# Patient Record
Sex: Female | Born: 1935 | Race: White | Hispanic: No | Smoking: Never smoker
Health system: Southern US, Community
[De-identification: ages and names within clinical notes are randomized; demographics above are authoritative.]

## PROBLEM LIST (undated history)

## (undated) DIAGNOSIS — C801 Malignant (primary) neoplasm, unspecified: Secondary | ICD-10-CM

## (undated) DIAGNOSIS — K219 Gastro-esophageal reflux disease without esophagitis: Secondary | ICD-10-CM

## (undated) DIAGNOSIS — J45909 Unspecified asthma, uncomplicated: Secondary | ICD-10-CM

## (undated) DIAGNOSIS — D759 Disease of blood and blood-forming organs, unspecified: Secondary | ICD-10-CM

## (undated) HISTORY — PX: CHOLECYSTECTOMY: SHX55

## (undated) HISTORY — DX: Gastro-esophageal reflux disease without esophagitis: K21.9

## (undated) HISTORY — PX: COLONOSCOPY: SHX174

## (undated) HISTORY — PX: UPPER GASTROINTESTINAL ENDOSCOPY: SHX188

## (undated) HISTORY — PX: ROTATOR CUFF REPAIR: SHX139

## (undated) HISTORY — PX: ABDOMINAL HYSTERECTOMY: SHX81

## (undated) HISTORY — DX: Unspecified asthma, uncomplicated: J45.909

---

## 1999-08-04 ENCOUNTER — Inpatient Hospital Stay (HOSPITAL_COMMUNITY): Admission: AD | Admit: 1999-08-04 | Discharge: 1999-08-05 | Payer: Self-pay | Admitting: Cardiology

## 1999-08-05 ENCOUNTER — Encounter: Payer: Self-pay | Admitting: Cardiology

## 2007-08-06 ENCOUNTER — Ambulatory Visit: Payer: Self-pay | Admitting: Cardiology

## 2007-08-12 ENCOUNTER — Ambulatory Visit: Payer: Self-pay | Admitting: Cardiology

## 2007-09-06 ENCOUNTER — Ambulatory Visit: Payer: Self-pay | Admitting: Cardiology

## 2010-05-17 NOTE — Assessment & Plan Note (Signed)
[facility]                          [facility]   NAME:Alicia Campbell, Alicia Campbell                        MRN:          [id number]  DATE:08/06/2007                            DOB:          Apr 20, 1935    Len Childs is referred for cardiac evaluation.  She is very pleasant  lady.  She is 75 years of age.  There is no prior documented coronary  disease.  She had had some shortness of breath and had actually been  evaluated in 2001.  CT scan had showed no pulmonary embolus at that time  and a nuclear scan revealed no significant abnormalities.  She is  active.  She walks regularly and can walk 2 miles a day on level ground  without any significant problems.  However, when she walks up a hill in  her backyard she gets shortness of breath and fatigue in her legs.  This  happens on a regular basis.  She does not have chest pain.  She has had  no syncope or presyncope.  She goes about full activities.  There is no  history of significant diabetes or hypertension.  She does have a family  history of coronary disease.   PAST MEDICAL HISTORY:   ALLERGIES:  All of the CILLINs.   MEDICATIONS:  Vitamins and iron.   OTHER MEDICAL PROBLEMS:  See the list below.   SOCIAL HISTORY:  The patient lives here in town and she does not smoke.   FAMILY HISTORY:  There is a family history of heart disease.  Her mother  died at a young age of heart disease.   REVIEW OF SYSTEMS:  Other than the HPI, her review of systems today is  negative.   PHYSICAL EXAMINATION:  VITAL SIGNS:  Blood pressure is 142/87.  Pulse is  80.  Weight is 155 pounds.  GENERAL:  The patient is oriented to person, time and place.  Affect is  normal.  HEENT:  No xanthelasma.  She has normal extraocular motion.  NECK:  There are no carotid bruits.  There is no jugular venous  distention.  LUNGS:  Clear.  Respiratory effort is not labored.  CARDIAC:  An S1 with an S2.  There are no clicks or  significant murmurs.  ABDOMEN:  Soft.  EXTREMITIES:  She has no significant peripheral edema.   The patient had an echocardiogram done on July 22, 2007, and read by Dr.  Shelva Majestic.  She has normal left ventricular function and size.  She has  mild mitral annular calcification.  There were no significant wall  motion abnormalities.  EKG reveals no significant abnormalities.   PROBLEMS:  1. History of a hysterectomy and gallbladder surgery in the past.  2. History of sinus surgery  3. Question of some asthma historically.  She rarely uses an inhaler.  4. History of shortness of breath and leg fatigue when walking up a      hill.  5. Normal left ventricular function.   At this point, it is not clear if the patient's symptoms are related to  ischemia or  not.  We will proceed with a nuclear scan with her walking  on the treadmill.  She will also have a leg Doppler.  I will then see  her back for followup.  It is possible that some of her symptoms are in  fact the form of exertional asthma.  We will see her back for followup  after the testing is done.     Luis Abed, MD, [facility]  Electronically Signed    JDK/MedQ  DD: 08/06/2007  DT: 08/07/2007  Job #: [id number]   cc:   Doreen Beam, MD

## 2010-05-17 NOTE — Assessment & Plan Note (Signed)
[facility]                          [facility]   NAME:CROSSHermelinda, Campbell                        MRN:          [id number]  DATE:09/06/2007                            DOB:          Mar 20, 1935    Alicia Campbell is here for followup.  I saw her on August 06, 2007.  We  decided that we should check ABIs and they were completely normal.  Also, she had a nuclear scan revealing no ischemia.  She is actually  feeling well.  She does have some shortness of breath and this may be  related to her asthma.  I do not think that it is cardiac in origin at  this time.  I had a nice discussion with the patient.  We reviewed all  of this.  Further workup was not necessary.   PAST MEDICAL HISTORY:   ALLERGIES:  All of the CILLINs.   MEDICATIONS:  Vitamins, lisinopril and omeprazole.   OTHER MEDICAL PROBLEMS:  See the list on the note of August 06, 2007.   REVIEW OF SYSTEMS:  Her review of systems today is negative.  She feels  well.   PHYSICAL EXAMINATION:  VITAL SIGNS:  Blood pressure is 125/77.  LUNGS:  Clear.  Respiratory effort is not labored.  CARDIAC:  Reveals an S1 with an S2.  There are no clicks or significant  murmurs.   Problems are listed on the note of August 06, 2007.  She is stable.  No  further cardiac workup is needed.  I will be happy to see her over time  as needed.     Luis Abed, MD, [facility]  Electronically Signed    JDK/MedQ  DD: 09/06/2007  DT: 09/07/2007  Job #: [id number]   cc:   Doreen Beam, MD

## 2010-05-20 NOTE — Discharge Summary (Signed)
[place]  Patient:    Alicia Campbell, Alicia Campbell                        MRN: [id number] Adm. Date:  [id number] Disc. Date: [id number] Attending:  Mirian Mo Dictator:   Joellyn Rued, P.A.C. CC:         Dr. Derald Macleod, [place]   Discharge Summary  DATE OF BIRTH: 1935/10/23  HISTORY OF PRESENT ILLNESS: Alicia Campbell is a 74 year old white female, who presented with chest heaviness and shortness of breath, and slight presyncope for the past six months.  She had noted gradual increase in dyspnea on exertion with walking up stairs and when lifting groceries.  She had also noted intermittent left-sided chest discomfort, a vague aching sensation which lasted minutes and spontaneously resolved with rest.  This was often brought on by exertion, but would occasionally happen at rest, approximately two times per week, and occasionally she would have shortness of breath with these episodes.  She denied nausea or vomiting or diaphoresis.  The morning of presentation the patient left to walk her usual 1.5 miles, and after about three-quarters of a mile she suddenly became short of breath and felt like she was going to pass out.  She also complained of chest heaviness, and felt as if she were going to die.  She walked home slowly and the heaviness gradually resolved within five minutes.  She was sent to the emergency room by her primary care physician because of her risk factors.  Her risk factors include age, and early family history.  Otherwise, her history is essentially unremarkable.  LABORATORY DATA: EKG showed sinus bradycardia with low voltage, delayed R wave progression.  Chest x-ray did not show any abnormality.  CT did not show any evidence of pulmonary embolus.  Hemoglobin 11.9, hematocrit 34.9; normal indices; platelets 132,000; WBC 7.9. PT was 13.9, PTT 42.  D-dimer was 0.47.  Sodium 140, potassium 3.6, glucose 169, BUN 12, creatinine 0.8.  CKs  and troponins were negative for myocardial infarction.  HOSPITAL COURSE: Overnight she did not have any further chest discomfort.  She ruled out for myocardial infarction.  A stress Cardiolite study was performed, and she ambulated for a total of three minutes and 51 minutes, achieving a heart rate of 141, which was greater than 85% expected maximal heart rate. She did have chest discomfort and shortness of breath, which promptly resolved.  She did have slight ST segment depression in leads V4 and V6; however, these also resolved.  Her echocardiogram showed normal LV function, mild mitral valve prolapse.  Stress imaging showed an EF of 75%, no signs of ischemia.  DISCHARGE DIAGNOSIS: Noncardiac chest discomfort, with negative stress Cardiolite study and echocardiogram. DISCHARGE MEDICATIONS:  1. Allegra.  2. Nasonex.  DISCHARGE DIET: Maintain low-fat/low-salt/low-cholesterol diet.  FOLLOW-UP: Arrange a follow-up appointment with Dr. Nobie Putnam in [place], [place]. DD:  09/02/99 TD:  09/02/99 Job: [id number]

## 2010-08-15 ENCOUNTER — Other Ambulatory Visit (INDEPENDENT_AMBULATORY_CARE_PROVIDER_SITE_OTHER): Payer: Self-pay | Admitting: Internal Medicine

## 2011-02-01 ENCOUNTER — Other Ambulatory Visit (HOSPITAL_COMMUNITY): Payer: Self-pay | Admitting: Rheumatology

## 2011-02-01 DIAGNOSIS — I739 Peripheral vascular disease, unspecified: Secondary | ICD-10-CM

## 2011-02-07 ENCOUNTER — Ambulatory Visit (HOSPITAL_COMMUNITY)
Admission: RE | Admit: 2011-02-07 | Discharge: 2011-02-07 | Disposition: A | Discharge status: Home / Self Care | Payer: Medicare Other | Source: Ambulatory Visit | Attending: Rheumatology | Admitting: Rheumatology

## 2011-02-07 DIAGNOSIS — M79609 Pain in unspecified limb: Secondary | ICD-10-CM | POA: Insufficient documentation

## 2011-02-07 DIAGNOSIS — I739 Peripheral vascular disease, unspecified: Secondary | ICD-10-CM

## 2011-02-07 DIAGNOSIS — M545 Low back pain, unspecified: Secondary | ICD-10-CM | POA: Insufficient documentation

## 2011-02-07 DIAGNOSIS — M5126 Other intervertebral disc displacement, lumbar region: Secondary | ICD-10-CM | POA: Insufficient documentation

## 2011-02-08 ENCOUNTER — Other Ambulatory Visit (HOSPITAL_COMMUNITY): Payer: Self-pay | Admitting: Rheumatology

## 2011-02-08 DIAGNOSIS — M6281 Muscle weakness (generalized): Secondary | ICD-10-CM

## 2011-02-08 DIAGNOSIS — M545 Low back pain: Secondary | ICD-10-CM

## 2011-02-08 DIAGNOSIS — R238 Other skin changes: Secondary | ICD-10-CM

## 2011-02-08 DIAGNOSIS — R700 Elevated erythrocyte sedimentation rate: Secondary | ICD-10-CM

## 2011-02-14 ENCOUNTER — Ambulatory Visit (HOSPITAL_COMMUNITY): Admission: RE | Admit: 2011-02-14 | Payer: Medicare Other | Source: Ambulatory Visit

## 2011-02-21 ENCOUNTER — Ambulatory Visit (HOSPITAL_COMMUNITY)
Admission: RE | Admit: 2011-02-21 | Discharge: 2011-02-21 | Disposition: A | Discharge status: Home / Self Care | Payer: Medicare Other | Source: Ambulatory Visit | Attending: Rheumatology | Admitting: Rheumatology

## 2011-02-21 DIAGNOSIS — R161 Splenomegaly, not elsewhere classified: Secondary | ICD-10-CM | POA: Insufficient documentation

## 2011-02-21 DIAGNOSIS — R700 Elevated erythrocyte sedimentation rate: Secondary | ICD-10-CM | POA: Insufficient documentation

## 2011-02-21 DIAGNOSIS — R238 Other skin changes: Secondary | ICD-10-CM

## 2011-02-21 DIAGNOSIS — M6281 Muscle weakness (generalized): Secondary | ICD-10-CM | POA: Insufficient documentation

## 2011-02-21 DIAGNOSIS — M545 Low back pain, unspecified: Secondary | ICD-10-CM | POA: Insufficient documentation

## 2011-02-21 MED ORDER — IOHEXOL 300 MG/ML  SOLN
100.0000 mL | Freq: Once | INTRAMUSCULAR | Status: AC | PRN
  Administered 2011-02-21: 100 mL via INTRAVENOUS

## 2011-02-23 LAB — POCT I-STAT, CHEM 8
Creatinine, Ser: 0.8 mg/dL (ref 0.50–1.10)
HCT: 40 % (ref 36.0–46.0)
Hemoglobin: 13.6 g/dL (ref 12.0–15.0)
Potassium: 4.4 mEq/L (ref 3.5–5.1)
Sodium: 138 mEq/L (ref 135–145)
TCO2: 25 mmol/L (ref 0–100)

## 2011-08-25 ENCOUNTER — Other Ambulatory Visit (INDEPENDENT_AMBULATORY_CARE_PROVIDER_SITE_OTHER): Payer: Self-pay | Admitting: Internal Medicine

## 2011-08-28 NOTE — Telephone Encounter (Signed)
I have requested records from [facility]

## 2011-08-31 ENCOUNTER — Ambulatory Visit (INDEPENDENT_AMBULATORY_CARE_PROVIDER_SITE_OTHER): Payer: Medicare Other | Admitting: Internal Medicine

## 2011-08-31 ENCOUNTER — Encounter (INDEPENDENT_AMBULATORY_CARE_PROVIDER_SITE_OTHER): Payer: Self-pay | Admitting: Internal Medicine

## 2011-08-31 VITALS — BP 140/64 | HR 80 | Temp 98.1°F | Ht 62.5 in | Wt 159.3 lb

## 2011-08-31 DIAGNOSIS — K219 Gastro-esophageal reflux disease without esophagitis: Secondary | ICD-10-CM

## 2011-08-31 DIAGNOSIS — J45909 Unspecified asthma, uncomplicated: Secondary | ICD-10-CM | POA: Insufficient documentation

## 2011-08-31 MED ORDER — SUCRALFATE 1 GM/10ML PO SUSP: 1.0000 g | Freq: Four times a day (QID) | ORAL | Status: DC

## 2011-08-31 NOTE — Addendum Note (Signed)
Addended by: Len Blalock on: 08/31/2011 09:54 AM   Modules accepted: Level of Service

## 2011-08-31 NOTE — Progress Notes (Addendum)
Subjective:     Patient ID: Alicia Campbell, female   DOB: 1935-09-01, 76 y.o.   MRN: [id number]  HPI  Alicia Campbell presents today with c/o of esophageal burning all down to her stomach. She has to cough and clear her throat all the time. She feels like their is congestion in her throat. Symptoms x 2 weels She was seen Dr Debria Garret at [facility] and given samples of Dexilant and Rx for Protonix.  She says foods feel like they are coming back up in her esohagus after she eats a couple of hour later. No dysphagia.  She has a choking feeling after she eats. Symptoms have lasted 3-5 days. Appetite is good. No weight loss.  No abdominal pain. She does have esohpageal burning.  No recent antibiotics.   Review of Systems see hpi Current Outpatient Prescriptions  Medication Sig Dispense Refill  . dexlansoprazole (DEXILANT) 60 MG capsule Take 60 mg by mouth daily.      Marland Kitchen omeprazole (PRILOSEC) 20 MG capsule TAKE ONE CAPSULE BY MOUTH TWICE DAILY  60 capsule  3  . pantoprazole (PROTONIX) 40 MG tablet Take 40 mg by mouth daily.       Past Medical History  Diagnosis Date  . Asthma    Past Surgical History  Procedure Date  . Cholecystectomy   . Rotator cuff repair     left  . Abdominal hysterectomy     Ovaries intact   History   Social History  . Marital Status: Widowed    Spouse Name: N/A    Number of Children: N/A  . Years of Education: N/A   Occupational History  . Not on file.   Social History Main Topics  . Smoking status: Never Smoker   . Smokeless tobacco: Not on file  . Alcohol Use: No  . Drug Use: No  . Sexually Active: Not on file   Other Topics Concern  . Not on file   Social History Narrative  . No narrative on file   Family Status  Relation Status Death Age  . Mother Deceased     CAD  . Father Deceased     CAD, DM2   Allergies  Allergen Reactions  . Penicillins     Hives        Objective:   Physical Exam  Filed Vitals:   08/31/11 0926    Height: 5' 2.5" (1.588 m)  Weight: 159 lb 4.8 oz (72.258 kg)   Alert and oriented. Skin warm and dry. Oral mucosa is moist.   . Sclera anicteric, conjunctivae is pink. Thyroid not enlarged. No cervical lymphadenopathy. Lungs clear. Heart regular rate and rhythm.  Abdomen is soft. Bowel sounds are positive. No hepatomegaly. No abdominal masses felt. No tenderness.  No edema to lower extremities.       Assessment:   Possible PUD, esophagitis. Dexilant is helping.    Plan:    Carafate 1gm QId, Continue the Dexilant.  When finished, start the Protonix. OV in one month.  H pylori.  If not better will consider EGD with Dr. Karilyn Cota.

## 2011-08-31 NOTE — Patient Instructions (Addendum)
H.pylori. Continue the Dexilant. When finished Dexilant start the Protonix. OV in 1 month with me.  If not better EGD.

## 2011-09-01 LAB — H. PYLORI ANTIBODY, IGG: H Pylori IgG: 0.9 {ISR}

## 2011-10-25 ENCOUNTER — Encounter (INDEPENDENT_AMBULATORY_CARE_PROVIDER_SITE_OTHER): Payer: Self-pay | Admitting: Internal Medicine

## 2011-10-25 ENCOUNTER — Ambulatory Visit (INDEPENDENT_AMBULATORY_CARE_PROVIDER_SITE_OTHER): Payer: Medicare Other | Admitting: Internal Medicine

## 2011-10-25 VITALS — BP 146/64 | HR 76 | Temp 98.0°F | Ht 62.5 in | Wt 160.6 lb

## 2011-10-25 DIAGNOSIS — K219 Gastro-esophageal reflux disease without esophagitis: Secondary | ICD-10-CM

## 2011-10-25 MED ORDER — OMEPRAZOLE 20 MG PO CPDR: 20.0000 mg | DELAYED_RELEASE_CAPSULE | Freq: Every day | ORAL | Status: DC

## 2011-10-25 MED ORDER — OMEPRAZOLE 20 MG PO CPDR: 20.0000 mg | DELAYED_RELEASE_CAPSULE | Freq: Two times a day (BID) | ORAL | Status: DC

## 2011-10-25 NOTE — Progress Notes (Signed)
Subjective:     Patient ID: Alicia Campbell, female   DOB: 1935-12-02, 76 y.o.   MRN: [id number]  HPI Here today for f/u. She was seen last month for burning in her esophagus. She had a cough. She had symptoms for 2 weeks. She saw Dr. Debria Garret at [facility] and given sampes of Dexilant and an Rx for Protonix. I gave her an Rx for Carafate liquid which helped. She tells me she feels good. Presently taking Protonix. She is symptom free. She cut Protonix to 20 mg daily. No acid reflux. Appetite is good. No weight loss. 100% better.   Review of Systems see hpi Current Outpatient Prescriptions  Medication Sig Dispense Refill  . dexlansoprazole (DEXILANT) 60 MG capsule Take 60 mg by mouth daily.      Marland Kitchen omeprazole (PRILOSEC) 20 MG capsule TAKE ONE CAPSULE BY MOUTH TWICE DAILY  60 capsule  3  . pantoprazole (PROTONIX) 40 MG tablet Take 40 mg by mouth daily.      . sucralfate (CARAFATE) 1 GM/10ML suspension Take 10 mLs (1 g total) by mouth 4 (four) times daily.  420 mL  1   Past Medical History  Diagnosis Date  . Asthma   . GERD (gastroesophageal reflux disease)    Past Surgical History  Procedure Date  . Cholecystectomy   . Rotator cuff repair     left  . Abdominal hysterectomy     Ovaries intact   History   Social History  . Marital Status: Widowed    Spouse Name: N/A    Number of Children: N/A  . Years of Education: N/A   Occupational History  . Not on file.   Social History Main Topics  . Smoking status: Never Smoker   . Smokeless tobacco: Not on file  . Alcohol Use: No  . Drug Use: No  . Sexually Active: Not on file   Other Topics Concern  . Not on file   Social History Narrative  . No narrative on file   Family Status  Relation Status Death Age  . Mother Deceased     CAD  . Father Deceased     CAD, DM2   Allergies  Allergen Reactions  . Penicillins     Hives        Objective:   Physical Exam  Filed Vitals:   10/25/11 0944  BP: 146/64    Pulse: 76  Temp: 98 F (36.7 C)  Height: 5' 2.5" (1.588 m)  Weight: 160 lb 9.6 oz (72.848 kg)  Alert and oriented. Skin warm and dry. Oral mucosa is moist.   . Sclera anicteric, conjunctivae is pink. Thyroid not enlarged. No cervical lymphadenopathy. Lungs clear. Heart regular rate and rhythm.  Abdomen is soft. Bowel sounds are positive. No hepatomegaly. No abdominal masses felt. No tenderness.  No edema to lower extremities.        Assessment:    GERD. She feels 100% better at this time. Symptoms are controlled.     Plan:     OV prn. Will e prescribe an RX for Omeprazole which she is requesting.  Finish the Protonix before picking up the Omeprazole.  May return on a prn basis. Any problems call our office.

## 2011-10-25 NOTE — Patient Instructions (Addendum)
Rx for Omeprazole to [facility]. OV prn

## 2011-11-07 ENCOUNTER — Encounter (INDEPENDENT_AMBULATORY_CARE_PROVIDER_SITE_OTHER): Payer: Self-pay

## 2012-01-01 ENCOUNTER — Encounter: Payer: Medicare Other | Admitting: Internal Medicine

## 2012-01-01 DIAGNOSIS — D696 Thrombocytopenia, unspecified: Secondary | ICD-10-CM

## 2012-01-22 ENCOUNTER — Encounter (INDEPENDENT_AMBULATORY_CARE_PROVIDER_SITE_OTHER): Payer: Medicare Other | Admitting: Internal Medicine

## 2012-01-22 DIAGNOSIS — D696 Thrombocytopenia, unspecified: Secondary | ICD-10-CM

## 2012-01-22 DIAGNOSIS — I10 Essential (primary) hypertension: Secondary | ICD-10-CM

## 2012-01-29 ENCOUNTER — Other Ambulatory Visit: Payer: Self-pay

## 2012-02-02 ENCOUNTER — Other Ambulatory Visit (INDEPENDENT_AMBULATORY_CARE_PROVIDER_SITE_OTHER): Payer: Self-pay | Admitting: Internal Medicine

## 2012-11-23 ENCOUNTER — Other Ambulatory Visit (INDEPENDENT_AMBULATORY_CARE_PROVIDER_SITE_OTHER): Payer: Self-pay | Admitting: Internal Medicine

## 2012-11-27 NOTE — Telephone Encounter (Signed)
Apt has been scheduled for 04/08/13 with Dr. Rehman.  

## 2013-02-20 ENCOUNTER — Other Ambulatory Visit (INDEPENDENT_AMBULATORY_CARE_PROVIDER_SITE_OTHER): Payer: Self-pay | Admitting: Internal Medicine

## 2013-02-20 DIAGNOSIS — K219 Gastro-esophageal reflux disease without esophagitis: Secondary | ICD-10-CM

## 2013-03-25 ENCOUNTER — Encounter: Payer: Self-pay | Admitting: *Deleted

## 2013-03-25 ENCOUNTER — Ambulatory Visit (INDEPENDENT_AMBULATORY_CARE_PROVIDER_SITE_OTHER): Payer: 59 | Admitting: Cardiology

## 2013-03-25 VITALS — BP 145/79 | HR 100 | Ht 62.5 in | Wt 147.0 lb

## 2013-03-25 DIAGNOSIS — R079 Chest pain, unspecified: Secondary | ICD-10-CM

## 2013-03-25 DIAGNOSIS — R0600 Dyspnea, unspecified: Secondary | ICD-10-CM

## 2013-03-25 DIAGNOSIS — R0609 Other forms of dyspnea: Secondary | ICD-10-CM

## 2013-03-25 DIAGNOSIS — R0989 Other specified symptoms and signs involving the circulatory and respiratory systems: Secondary | ICD-10-CM

## 2013-03-25 NOTE — Patient Instructions (Signed)
Your physician recommends that you continue on your current medications as directed. Please refer to the Current Medication list given to you today.  We will call you later today or tomorrow if you need further testing or follow up. If you have any question call Clemie at [phone].

## 2013-03-25 NOTE — Progress Notes (Signed)
Clinical Summary Ms. Acklin is a 78 y.o.female seen today as a new patient. She is referred for dyspnea.   1. Dyspnea/Chest pain - started 4 months ago. Occurs with activity, such as walking short distances.  - Can get chest pain at times x 4 months. Sharp pain in midchest to back, mild pain. Occurs only with exertion. She is unsure how long pains lasts for. She is unsure of change in frequency or severity over time. - denies any LE edema, no orthopnea though sleeps on wedge for GERD, no PND - hx of asthma, followed by Dr Luan Pulling. Has been treated over the last 4 months with abx and steroids without improvement in her SOB. She also has anemia with most recent Hgb 8.2, this is followed by her pcp.   CAD risk factors: father MI age 62, mother age 57 ? heart related congenital heart disease, uncle MI in 39s.   Past Medical History  Diagnosis Date  . Asthma   . GERD (gastroesophageal reflux disease)      Allergies  Allergen Reactions  . Penicillins     Hives     Current Outpatient Prescriptions  Medication Sig Dispense Refill  . omeprazole (PRILOSEC) 20 MG capsule TAKE ONE CAPSULE BY MOUTH TWICE DAILY  60 capsule  6  . pantoprazole (PROTONIX) 40 MG tablet Take 40 mg by mouth daily.       No current facility-administered medications for this visit.     Past Surgical History  Procedure Laterality Date  . Cholecystectomy    . Rotator cuff repair      left  . Abdominal hysterectomy      Ovaries intact     Allergies  Allergen Reactions  . Penicillins     Hives      Family History  Problem Relation Age of Onset  . Heart attack Mother 15  . Congenital heart disease Mother   . Diabetes Father      Social History Ms. Pruitt reports that she has never smoked. She has never used smokeless tobacco. Ms. Gumbs reports that she does not drink alcohol.   Review of Systems CONSTITUTIONAL: No weight loss, fever, chills, weakness or fatigue.  HEENT: Eyes: No visual  loss, blurred vision, double vision or yellow sclerae.No hearing loss, sneezing, congestion, runny nose or sore throat.  SKIN: No rash or itching.  CARDIOVASCULAR: per HPI RESPIRATORY: SOB GASTROINTESTINAL: No anorexia, nausea, vomiting or diarrhea. No abdominal pain or blood.  GENITOURINARY: No burning on urination, no polyuria NEUROLOGICAL: No headache, dizziness, syncope, paralysis, ataxia, numbness or tingling in the extremities. No change in bowel or bladder control.  MUSCULOSKELETAL: No muscle, back pain, joint pain or stiffness.  LYMPHATICS: No enlarged nodes. No history of splenectomy.  PSYCHIATRIC: No history of depression or anxiety.  ENDOCRINOLOGIC: No reports of sweating, cold or heat intolerance. No polyuria or polydipsia.  Marland Kitchen   Physical Examination p 100 bp 145/79 Wt 147 lbs BMI 26 Gen: resting comfortably, no acute distress HEENT: no scleral icterus, pupils equal round and reactive, no palptable cervical adenopathy,  CV: RRR, no m/r/g, no JVD, no carotid bruits Resp: Clear to auscultation bilaterally GI: abdomen is soft, non-tender, non-distended, normal bowel sounds, no hepatosplenomegaly MSK: extremities are warm, no edema.  Skin: warm, no rash Neuro:  no focal deficits Psych: appropriate affect   Diagnostic Studies Echo PCP Office 03/2013 My read: LVEF 60-65%, cannot distinguish WMAs due to imaging, grade I diastolic dysfunction, aortic and mitral valve  anular calcification, small pericardial effusino adjacent to RV with no tamponade.      Assessment and Plan  1. Dyspnea/chest pain - symptoms and risk factors concerning for possible cardiac etiology, including obstructive CAD - will obtain stress echo with pulse ox to further evaluate        Arnoldo Lenis, M.D., F.A.C.C.

## 2013-03-26 ENCOUNTER — Encounter: Payer: Self-pay | Admitting: Cardiology

## 2013-03-26 ENCOUNTER — Telehealth: Payer: Self-pay | Admitting: Cardiology

## 2013-03-26 DIAGNOSIS — R079 Chest pain, unspecified: Secondary | ICD-10-CM

## 2013-03-26 DIAGNOSIS — I2510 Atherosclerotic heart disease of native coronary artery without angina pectoris: Secondary | ICD-10-CM

## 2013-03-26 DIAGNOSIS — I25810 Atherosclerosis of coronary artery bypass graft(s) without angina pectoris: Secondary | ICD-10-CM

## 2013-03-26 NOTE — Telephone Encounter (Signed)
Exercise stress echo for 786.51 scheduled for 04-09-13 @ [employer] [facility]

## 2013-03-26 NOTE — Telephone Encounter (Signed)
Message copied by Lewayne Bunting on Wed Mar 26, 2013 12:07 PM ------      Message from: [place] F      Created: Tue Mar 25, 2013 11:41 AM       Please let patient know that I reviewed her echo, overall looks normal. Please order an exercise stress echo for chest pain to be done at [facility], she does not need to hold any of her meds. Please make patient aware we are going to get stress test.            Carlyle Dolly MD ------

## 2013-03-26 NOTE — Telephone Encounter (Signed)
[name] exp 05-10-13

## 2013-03-26 NOTE — Telephone Encounter (Signed)
Called and informed pt of additional test needed. Pt verbalized understanding and pt will come in on Friday 03-28-13 to pick up instruction sheet.

## 2013-04-02 ENCOUNTER — Encounter (HOSPITAL_COMMUNITY): Payer: Self-pay

## 2013-04-02 ENCOUNTER — Ambulatory Visit (HOSPITAL_COMMUNITY)
Admission: RE | Admit: 2013-04-02 | Discharge: 2013-04-02 | Disposition: A | Discharge status: Home / Self Care | Payer: Medicare Other | Source: Ambulatory Visit | Attending: Cardiology | Admitting: Cardiology

## 2013-04-02 DIAGNOSIS — R0989 Other specified symptoms and signs involving the circulatory and respiratory systems: Secondary | ICD-10-CM | POA: Insufficient documentation

## 2013-04-02 DIAGNOSIS — R079 Chest pain, unspecified: Secondary | ICD-10-CM

## 2013-04-02 DIAGNOSIS — I25810 Atherosclerosis of coronary artery bypass graft(s) without angina pectoris: Secondary | ICD-10-CM

## 2013-04-02 DIAGNOSIS — R072 Precordial pain: Secondary | ICD-10-CM

## 2013-04-02 DIAGNOSIS — R0609 Other forms of dyspnea: Secondary | ICD-10-CM | POA: Insufficient documentation

## 2013-04-02 NOTE — Progress Notes (Signed)
Stress Lab Nurses Notes - Alicia Campbell  Alicia Campbell 04/02/2013 Reason for doing test: Chest Pain and Dyspnea Type of test: Stress Echo Nurse performing test: Gerrit Halls, RN Nuclear Medicine Tech: Not Applicable Echo Tech: Jamison Neighbor MD performing test: Branch/K.Purcell Nails NP Family MD: Woody Seller Test explained and consent signed: yes IV started: No IV started Symptoms: fatigue & Dyspnea on exertion Treatment/Intervention: None Reason test stopped: fatigue and SOB After recovery IV was: [facility] Patient to return to [place]. Med at : [facility] Patient discharged: Home Patient's Condition upon discharge was: stable Comments:  Resting O2 sat checked 98% room air. During test peak BP 160/70 ,HR 136 & O2 Sat 96% .  Recovery BP 150/75, HR 109 & O2 sat 98% .  Symptoms resolved in recovery. Geanie Cooley T

## 2013-04-02 NOTE — Progress Notes (Signed)
*  PRELIMINARY RESULTS* Echocardiogram Echocardiogram Stress Test has been performed.  [place], [place] 04/02/2013, 11:55 AM

## 2013-04-03 ENCOUNTER — Telehealth: Payer: Self-pay | Admitting: Cardiology

## 2013-04-03 NOTE — Telephone Encounter (Signed)
Pt informed of results. Offered to schedule 2 month appt today but pt wanted to wait and see primary care doctor before scheduling.

## 2013-04-03 NOTE — Telephone Encounter (Signed)
Message copied by Lewayne Bunting on Thu Apr 03, 2013 10:59 AM ------      Message from: [place] F      Created: Thu Apr 03, 2013  8:58 AM       Please let patient know that her stress test was normal. There does not appear to be a cardiac cause of her symptoms. I'd like to see her back in 2 months. Her pcp may also consider other testing for her.            Carlyle Dolly MD ------

## 2013-04-08 ENCOUNTER — Encounter (HOSPITAL_COMMUNITY): Payer: Self-pay | Admitting: Pharmacy Technician

## 2013-04-08 ENCOUNTER — Encounter (INDEPENDENT_AMBULATORY_CARE_PROVIDER_SITE_OTHER): Payer: Self-pay | Admitting: *Deleted

## 2013-04-08 ENCOUNTER — Other Ambulatory Visit (INDEPENDENT_AMBULATORY_CARE_PROVIDER_SITE_OTHER): Payer: Self-pay | Admitting: *Deleted

## 2013-04-08 ENCOUNTER — Encounter (INDEPENDENT_AMBULATORY_CARE_PROVIDER_SITE_OTHER): Payer: Self-pay | Admitting: Internal Medicine

## 2013-04-08 ENCOUNTER — Ambulatory Visit (INDEPENDENT_AMBULATORY_CARE_PROVIDER_SITE_OTHER): Payer: Medicare Other | Admitting: Internal Medicine

## 2013-04-08 VITALS — BP 130/70 | HR 82 | Temp 98.6°F | Resp 18 | Ht 62.5 in | Wt 144.8 lb

## 2013-04-08 DIAGNOSIS — R161 Splenomegaly, not elsewhere classified: Secondary | ICD-10-CM

## 2013-04-08 DIAGNOSIS — D696 Thrombocytopenia, unspecified: Secondary | ICD-10-CM | POA: Insufficient documentation

## 2013-04-08 DIAGNOSIS — D649 Anemia, unspecified: Secondary | ICD-10-CM

## 2013-04-08 DIAGNOSIS — R634 Abnormal weight loss: Secondary | ICD-10-CM

## 2013-04-08 DIAGNOSIS — R1310 Dysphagia, unspecified: Secondary | ICD-10-CM

## 2013-04-08 DIAGNOSIS — K219 Gastro-esophageal reflux disease without esophagitis: Secondary | ICD-10-CM

## 2013-04-08 DIAGNOSIS — R079 Chest pain, unspecified: Secondary | ICD-10-CM

## 2013-04-08 LAB — PROTIME-INR
INR: 1.22 (ref <1.50)
Prothrombin Time: 15.2 seconds (ref 11.6–15.2)

## 2013-04-08 LAB — CBC
HEMATOCRIT: 25 % — AB (ref 36.0–46.0)
HEMOGLOBIN: 8 g/dL — AB (ref 12.0–15.0)
MCH: 24.2 pg — AB (ref 26.0–34.0)
MCHC: 32 g/dL (ref 30.0–36.0)
MCV: 75.5 fL — ABNORMAL LOW (ref 78.0–100.0)
Platelets: 56 10*3/uL — ABNORMAL LOW (ref 150–400)
RBC: 3.31 MIL/uL — AB (ref 3.87–5.11)
RDW: 23.1 % — ABNORMAL HIGH (ref 11.5–15.5)
WBC: 2.4 10*3/uL — ABNORMAL LOW (ref 4.0–10.5)

## 2013-04-08 MED ORDER — PANTOPRAZOLE SODIUM 40 MG PO TBEC: 40.0000 mg | DELAYED_RELEASE_TABLET | Freq: Two times a day (BID) | ORAL | Status: DC

## 2013-04-08 NOTE — Progress Notes (Signed)
Presenting complaint;  Chest pain, dysphagia shortness of breath and weakness.  History of present illness;  Patient is 78 year old Caucasian female who was last seen in October 2013 for gastroesophageal reflux disease and now presents with multiple complaints. She states she has not been feeling well for the last 4 months. She began to experience exertional dyspnea, chest pain and dry hacking cough. She was evaluated by Dr. Linde Gillis of pulmonology service in [place], [place] and felt to have mild airway disease not contributing to her symptoms. She was subsequently evaluated by Dr. Carlyle Dolly, cardiologist with [facility] and noninvasive cardiac evaluation was negative and now she is here for further evaluation. She states she feels miserable. Her symptoms have progressed. For the last one month she has experienced nausea and dysphagia. She has poor appetite and has lost 15 pounds. She has most difficulty with solids and particularly breads. Chest pain is retrosternal and described as soreness worse after coughing spells. She states it feels different than heartburn. She denies vomiting abdominal pain melena or rectal bleeding. She also denies vaginal bleeding or hematuria. She does not take NSAIDs. She does not drink alcohol or smoke cigarettes. She underwent EGD at [facility] in August 2011 revealing erosive reflux esophagitis and hiatal hernia. She states her last colonoscopy was about 5 years ago and was within normal limits([facility]). Review of the systems is negative for fever chills or night sweats. She feels frustrated because her symptoms are getting worse instead of getting better.   Current Medications: Outpatient Encounter Prescriptions as of 04/08/2013  Medication Sig  . omeprazole (PRILOSEC) 20 MG capsule TAKE ONE CAPSULE BY MOUTH TWICE DAILY  . [DISCONTINUED] albuterol (VENTOLIN HFA) 108 (90 BASE) MCG/ACT inhaler Inhale 2 puffs into the lungs every 6 (six) hours as needed  for wheezing or shortness of breath.  . [DISCONTINUED] budesonide-formoterol (SYMBICORT) 160-4.5 MCG/ACT inhaler Inhale 2 puffs into the lungs 2 (two) times daily.  . [DISCONTINUED] ipratropium-albuterol (DUONEB) 0.5-2.5 (3) MG/3ML SOLN Take 3 mLs by nebulization 3 (three) times daily as needed.   Past medical history; Bronchial asthma diagnosed in 1979 following exposure to chemicals. Gastroesophageal reflux disease. Last EGD in August 2011 revealing erosive reflux esophagitis and hiatal hernia and she has been maintained on PPI. History of anemia and thrombocytopenia. Patient was evaluated by Dr. Audree Camel last year and apparently no cause found. Hysterectomy in early 1970s. Cholecystectomy in 1980s. Repair of her left sided rotator cuff tear in 1990s.  Allergies; Allergies  Allergen Reactions  . Codeine Nausea And Vomiting  . Penicillins     Hives    Objective: Blood pressure 130/70, pulse 82, temperature 98.6 F (37 C), temperature source Oral, resp. rate 18, height 5' 2.5" (1.588 m), weight 144 lb 12.8 oz (65.681 kg). Patient is alert and in no acute distress. She does not have asterixis. Conjunctiva is pale. Sclera is nonicteric Oropharyngeal mucosa is normal. No neck masses or thyromegaly noted. Cardiac exam with regular rhythm normal S1 and S2. No murmur or gallop noted. Lungs are clear to auscultation. Abdomen is full. Bowel sounds are normal. No bruits noted. On palpation abdomen is soft without tenderness or masses. Spleen is easily palpable. Liver edge is indistinct. Rectal examination reveals no stool in the vault but mucus is guaiac negative.  No LE edema or clubbing noted.  Labs/studies Results: Scanned blood work in [employer]. CBC from 03/14/2013.  WBC 3.0, H&H 8.2 and 25.9, MCV 79 and platelet count 43K. Serum sodium 137, potassium  4.1, chloride 98, CO2 22, BUN 15, creatinine 0.68 and calcium 8.9. Bilirubin 0.5, BP 105, AST 35, ALT 24, total protein 7.5 with  albumin of 4.4.  Serum B12 365 CT angioid chest on 10/01/2012 reveals no evidence of pulmonary emboli and revealed calcification to right lobe of thyroid gland.  Assessment:  #1. Dysphagia. Patient has history of GERD. She could have developed an esophageal stricture or she could have motility disorder. This symptom needs to be further evaluated with EGD. #2. Chest pain. Noninvasive cardiac workup negative. Chest pain may well be secondary to esophagitis. Some of her pain appears to be musculoskeletal. #3. Exertional dyspnea and weakness most likely secondary to anemia. #4. Patient has pancytopenia and splenomegaly. No stigmata of liver disease or history of liver disease. Cirrhosis remains in differential diagnosis. Even if she has cirrhosis but would not explain her constitutional symptoms. #5. Weight loss possibly related to anorexia and dysphagia.  Recommendations;  Discontinue omeprazole. Begin pantoprazole 40 mg by mouth twice a day. CBC and INR. Esophagogastroduodenoscopy and possible esophageal dilation unless platelet count is very low. Abdominopelvic CT with contrast. Will request records from Dr. Reynaldo Minium office to determine extent of workup for thrombocytopenia and splenomegaly.

## 2013-04-08 NOTE — Patient Instructions (Signed)
Physician will call with results of blood work and CT when completed. Esophagogastroduodenoscopy to be scheduled

## 2013-04-09 ENCOUNTER — Inpatient Hospital Stay (HOSPITAL_COMMUNITY): Admission: RE | Admit: 2013-04-09 | Payer: 59 | Source: Ambulatory Visit

## 2013-04-11 ENCOUNTER — Encounter (HOSPITAL_COMMUNITY): Payer: Self-pay | Admitting: *Deleted

## 2013-04-11 ENCOUNTER — Encounter (HOSPITAL_COMMUNITY)
Admission: RE | Disposition: A | Discharge status: Home / Self Care | Payer: Self-pay | Source: Ambulatory Visit | Attending: Internal Medicine

## 2013-04-11 ENCOUNTER — Ambulatory Visit (HOSPITAL_COMMUNITY)
Admission: RE | Admit: 2013-04-11 | Discharge: 2013-04-11 | Disposition: A | Discharge status: Home / Self Care | Payer: Medicare Other | Source: Ambulatory Visit | Attending: Internal Medicine | Admitting: Internal Medicine

## 2013-04-11 DIAGNOSIS — R634 Abnormal weight loss: Secondary | ICD-10-CM

## 2013-04-11 DIAGNOSIS — D61818 Other pancytopenia: Secondary | ICD-10-CM | POA: Insufficient documentation

## 2013-04-11 DIAGNOSIS — R1310 Dysphagia, unspecified: Secondary | ICD-10-CM

## 2013-04-11 DIAGNOSIS — Z79899 Other long term (current) drug therapy: Secondary | ICD-10-CM | POA: Insufficient documentation

## 2013-04-11 DIAGNOSIS — R079 Chest pain, unspecified: Secondary | ICD-10-CM

## 2013-04-11 DIAGNOSIS — K219 Gastro-esophageal reflux disease without esophagitis: Secondary | ICD-10-CM

## 2013-04-11 DIAGNOSIS — R161 Splenomegaly, not elsewhere classified: Secondary | ICD-10-CM | POA: Insufficient documentation

## 2013-04-11 DIAGNOSIS — K449 Diaphragmatic hernia without obstruction or gangrene: Secondary | ICD-10-CM | POA: Insufficient documentation

## 2013-04-11 HISTORY — PX: ESOPHAGOGASTRODUODENOSCOPY: SHX5428

## 2013-04-11 HISTORY — PX: BALLOON DILATION: SHX5330

## 2013-04-11 HISTORY — PX: SAVORY DILATION: SHX5439

## 2013-04-11 HISTORY — PX: MALONEY DILATION: SHX5535

## 2013-04-11 SURGERY — EGD (ESOPHAGOGASTRODUODENOSCOPY)
Anesthesia: Moderate Sedation

## 2013-04-11 MED ORDER — MIDAZOLAM HCL 5 MG/5ML IJ SOLN
INTRAMUSCULAR | Status: DC | PRN
  Administered 2013-04-11: 2 mg via INTRAVENOUS
  Administered 2013-04-11: 1 mg via INTRAVENOUS
  Administered 2013-04-11: 2 mg via INTRAVENOUS
  Administered 2013-04-11 (×2): 1 mg via INTRAVENOUS

## 2013-04-11 MED ORDER — MEPERIDINE HCL 50 MG/ML IJ SOLN
INTRAMUSCULAR | Status: DC | PRN
  Administered 2013-04-11: 25 mg via INTRAVENOUS

## 2013-04-11 MED ORDER — BUTAMBEN-TETRACAINE-BENZOCAINE 2-2-14 % EX AERO
INHALATION_SPRAY | CUTANEOUS | Status: DC | PRN
  Administered 2013-04-11: 2 via TOPICAL

## 2013-04-11 MED ORDER — MEPERIDINE HCL 50 MG/ML IJ SOLN
INTRAMUSCULAR | Status: AC
  Filled 2013-04-11: qty 1

## 2013-04-11 MED ORDER — SODIUM CHLORIDE 0.9 % IV SOLN
INTRAVENOUS | Status: DC
  Administered 2013-04-11: 11:00:00 via INTRAVENOUS

## 2013-04-11 MED ORDER — MIDAZOLAM HCL 5 MG/5ML IJ SOLN
INTRAMUSCULAR | Status: AC
  Filled 2013-04-11: qty 10

## 2013-04-11 MED ORDER — STERILE WATER FOR IRRIGATION IR SOLN
Status: DC | PRN
  Administered 2013-04-11: 12:00:00

## 2013-04-11 NOTE — H&P (Addendum)
Alicia Campbell is an 78 y.o. female.   Chief Complaint: Patient is here for EGD and possible ED. HPI: Patient is 78 year old Caucasian female was chronic heard and presents with heartburn regurgitation dysphagia. She also claims of exertional dyspnea progressive weakness. She has history of anemia thrombocytopenia as well as splenomegaly. Following her office visit.  She denies melena or rectal bleeding. Rectal examination in the office revealed guaiac negative stool. CBC from 2 days ago revealed WBC of 2.4 H&H of 8 and 25 an MCV of 75.5 and platelet count 56K. INR was 1.22.   Past Medical History  Diagnosis Date  . Asthma   . GERD (gastroesophageal reflux disease)     Past Surgical History  Procedure Laterality Date  . Cholecystectomy    . Rotator cuff repair      left  . Abdominal hysterectomy      Ovaries intact  . Colonoscopy    . Upper gastrointestinal endoscopy      Family History  Problem Relation Age of Onset  . Heart attack Mother 31  . Congenital heart disease Mother   . Diabetes Father    Social History:  reports that she has never smoked. She has never used smokeless tobacco. She reports that she does not drink alcohol or use illicit drugs.  Allergies:  Allergies  Allergen Reactions  . Codeine Nausea And Vomiting  . Penicillins     Hives    Medications Prior to Admission  Medication Sig Dispense Refill  . pantoprazole (PROTONIX) 40 MG tablet Take 1 tablet (40 mg total) by mouth 2 (two) times daily before a meal.  60 tablet  5    No results found for this or any previous visit (from the past 48 hour(s)). No results found.  ROS  Blood pressure 150/71, pulse 94, temperature 98.1 F (36.7 C), temperature source Oral, resp. rate 18, height 5' 2.5" (1.588 m), weight 144 lb (65.318 kg), SpO2 98.00%. Physical Exam  Constitutional: She appears well-developed and well-nourished.  Eyes:  Conjunctiva is pale; sclerae nonicteric  Neck: No thyromegaly present.   Cardiovascular: Normal rate, regular rhythm and normal heart sounds.   No murmur heard. Respiratory: Effort normal and breath sounds normal.  GI: Soft. Tenderness: mild midepigastric tenderness.  Spleen is palpable.  Musculoskeletal: She exhibits no edema.  Lymphadenopathy:    She has no cervical adenopathy.  Neurological: She is alert.  Skin: Skin is warm and dry.     Assessment/Plan Refractory GERD and dysphagia. Pancytopenia and splenomegaly. CT does not show changes of cirrhosis. EGD and possible ED.  Alicia Campbell 04/11/2013, 12:04 PM

## 2013-04-11 NOTE — Op Note (Addendum)
EGD PROCEDURE REPORT  PATIENT:  Alicia Campbell  MR#:  [id number] Birthdate:  May 18, 1935, 78 y.o., female Endoscopist:  Dr. Rogene Houston, MD Referred By:  Dr. Glenda Chroman, MD Procedure Date: 04/11/2013  Procedure:   EGD with ED.  Indications:  Patient is 78 year old Caucasian female who presents with intermittent solid food dysphagia as well as regurgitation. She was seen in the office earlier in the week and PPI was changed. She also has pancytopenia and splenomegaly.  Platelet count was 56K and INR is normal.            Informed Consent:  The risks, benefits, alternatives & imponderables which include, but are not limited to, bleeding, infection, perforation, drug reaction and potential missed lesion have been reviewed.  The potential for biopsy, lesion removal, esophageal dilation, etc. have also been discussed.  Questions have been answered.  All parties agreeable.  Please see history & physical in medical record for more information.  Medications:  Demerol 25 mg IV Versed 7 mg IV Cetacaine spray topically for oropharyngeal anesthesia  Description of procedure:  The endoscope was introduced through the mouth and advanced to the second portion of the duodenum without difficulty or limitations. The mucosal surfaces were surveyed very carefully during advancement of the scope and upon withdrawal.  Findings:  Esophagus:  Mucosa of the esophagus was normal. The GE junction was unremarkable without ring or stricture formation. GEJ:  37 cm Hiatus:  39 cm Stomach:  Stomach was empty and distended very well with insufflation. Folds in the proximal stomach are normal. Examination mucosa at gastric body, antrum, pyloric channel, angularis, fundus and cardia was normal. Duodenum:  Normal bulbar and post bulbar mucosa.  Therapeutic/Diagnostic Maneuvers Performed:   Esophagus was dilated by passing 54 [place] Maloney dilator to full insertion.As the dilator was withdrawn  endoscope was passed  again and no mucosal disruption noted the esophagus.  Complications:  None  Impression: Small sliding hiatal hernia otherwise normal EGD. Esophagus dilated by passing 54 [place] Maloney dilator but no mucosal disruption noted.  Recommendations:  Patient will continue pantoprazole at 40 mg by mouth twice a day. Proceed with A/P CT as planned. Will arrange office visit with Dr. Audree Camel.  Rogene Houston  04/11/2013  12:36 PM  CC: Dr. Glenda Chroman., MD & Dr. Rayne Du ref. provider found

## 2013-04-11 NOTE — Discharge Instructions (Signed)
Resume usual medications and diet. No driving for 24 hours. OV with Dr. Jacquiline Doe  Esophagogastroduodenoscopy Care After Refer to this sheet in the next few weeks. These instructions provide you with information on caring for yourself after your procedure. Your caregiver may also give you more specific instructions. Your treatment has been planned according to current medical practices, but problems sometimes occur. Call your caregiver if you have any problems or questions after your procedure.  HOME CARE INSTRUCTIONS  Do not eat or drink anything until the numbing medicine (local anesthetic) has worn off and your gag reflex has returned. You will know that the local anesthetic has worn off when you can swallow comfortably.  Do not drive for 12 hours after the procedure or as directed by your caregiver.  Only take medicines as directed by your caregiver. SEEK MEDICAL CARE IF:   You cannot stop coughing.  You are not urinating at all or less than usual. SEEK IMMEDIATE MEDICAL CARE IF:  You have difficulty swallowing.  You cannot eat or drink.  You have worsening throat or chest pain.  You have dizziness, lightheadedness, or you faint.  You have nausea or vomiting.  You have chills.  You have a fever.  You have severe abdominal pain.  You have black, tarry, or bloody stools. Document Released: 12/06/2011 Document Reviewed: 12/06/2011 [facility] Patient Information 2014 [place], [place].

## 2013-04-14 ENCOUNTER — Ambulatory Visit (HOSPITAL_COMMUNITY)
Admission: RE | Admit: 2013-04-14 | Discharge: 2013-04-14 | Disposition: A | Discharge status: Home / Self Care | Payer: Medicare Other | Source: Ambulatory Visit | Attending: Internal Medicine | Admitting: Internal Medicine

## 2013-04-14 DIAGNOSIS — K7689 Other specified diseases of liver: Secondary | ICD-10-CM | POA: Insufficient documentation

## 2013-04-14 DIAGNOSIS — K5710 Diverticulosis of small intestine without perforation or abscess without bleeding: Secondary | ICD-10-CM | POA: Insufficient documentation

## 2013-04-14 DIAGNOSIS — R161 Splenomegaly, not elsewhere classified: Secondary | ICD-10-CM | POA: Insufficient documentation

## 2013-04-14 DIAGNOSIS — R110 Nausea: Secondary | ICD-10-CM | POA: Insufficient documentation

## 2013-04-14 DIAGNOSIS — R634 Abnormal weight loss: Secondary | ICD-10-CM | POA: Insufficient documentation

## 2013-04-14 MED ORDER — IOHEXOL 300 MG/ML  SOLN
100.0000 mL | Freq: Once | INTRAMUSCULAR | Status: AC | PRN
  Administered 2013-04-14: 100 mL via INTRAVENOUS

## 2013-04-15 ENCOUNTER — Encounter (HOSPITAL_COMMUNITY): Payer: Self-pay | Admitting: Internal Medicine

## 2013-04-15 ENCOUNTER — Encounter (INDEPENDENT_AMBULATORY_CARE_PROVIDER_SITE_OTHER): Payer: Self-pay

## 2013-05-06 ENCOUNTER — Ambulatory Visit (INDEPENDENT_AMBULATORY_CARE_PROVIDER_SITE_OTHER): Payer: 59 | Admitting: Internal Medicine

## 2013-10-10 ENCOUNTER — Ambulatory Visit (INDEPENDENT_AMBULATORY_CARE_PROVIDER_SITE_OTHER): Payer: Medicare Other | Admitting: Cardiology

## 2013-10-10 ENCOUNTER — Encounter: Payer: Self-pay | Admitting: Cardiology

## 2013-10-10 VITALS — BP 118/70 | HR 94 | Ht 62.0 in | Wt 148.4 lb

## 2013-10-10 DIAGNOSIS — R0789 Other chest pain: Secondary | ICD-10-CM

## 2013-10-10 NOTE — Patient Instructions (Signed)
There were no changes to your medications. Continue as directed. Your physician wants you to follow up in:  1 year.  You will receive a reminder letter in the mail one-two months in advance.  If you don't receive a letter, please call our office to schedule the follow up appointment. 

## 2013-10-10 NOTE — Progress Notes (Signed)
Clinical Summary Alicia Campbell is a 78 y.o.female seen today for follow up of the following medical problems.   1. Dyspnea/Chest pain  -  Occurs with activity, such as walking short distances.  - Can get chest pain at times x 4 months. Sharp pain in midchest to back, mild pain. Occurs only with exertion. She is unsure how long pains lasts for. She is unsure of change in frequency or severity over time.  - denies any LE edema, no orthopnea though sleeps on wedge for GERD, no PND  - hx of asthma, followed at [facility]. Has been treated over the last 4 months with abx and steroids without improvement in her SOB. She also has anemia with most recent Hgb 8.2, this is followed by her pcp.   - recent CT chest at [facility] 06/2013 for cough, incidental finding of severe CAD of LCX.  - stress echo 04/2013 without ischemia   Past Medical History  Diagnosis Date  . Asthma   . GERD (gastroesophageal reflux disease)      Allergies  Allergen Reactions  . Codeine Nausea And Vomiting  . Penicillins     Hives     Current Outpatient Prescriptions  Medication Sig Dispense Refill  . pantoprazole (PROTONIX) 40 MG tablet Take 1 tablet (40 mg total) by mouth 2 (two) times daily before a meal.  60 tablet  5   No current facility-administered medications for this visit.     Past Surgical History  Procedure Laterality Date  . Cholecystectomy    . Rotator cuff repair      left  . Abdominal hysterectomy      Ovaries intact  . Colonoscopy    . Upper gastrointestinal endoscopy    . Esophagogastroduodenoscopy N/A 04/11/2013    Procedure: ESOPHAGOGASTRODUODENOSCOPY (EGD);  Surgeon: Rogene Houston, MD;  Location: [facility];  Service: Endoscopy;  Laterality: N/A;  830-moved to 1020 Ann to notify pt  . Balloon dilation N/A 04/11/2013    Procedure: BALLOON DILATION;  Surgeon: Rogene Houston, MD;  Location: [facility];  Service: Endoscopy;  Laterality: N/A;  Venia Minks dilation N/A 04/11/2013   Procedure: Venia Minks DILATION;  Surgeon: Rogene Houston, MD;  Location: [facility];  Service: Endoscopy;  Laterality: N/A;  . Savory dilation N/A 04/11/2013    Procedure: SAVORY DILATION;  Surgeon: Rogene Houston, MD;  Location: [facility];  Service: Endoscopy;  Laterality: N/A;     Allergies  Allergen Reactions  . Codeine Nausea And Vomiting  . Penicillins     Hives      Family History  Problem Relation Age of Onset  . Heart attack Mother 57  . Congenital heart disease Mother   . Diabetes Father      Social History Ms. Oscarson reports that she has never smoked. She has never used smokeless tobacco. Ms. Domke reports that she does not drink alcohol.   Review of Systems CONSTITUTIONAL: No weight loss, fever, chills, weakness or fatigue.  HEENT: Eyes: No visual loss, blurred vision, double vision or yellow sclerae.No hearing loss, sneezing, congestion, runny nose or sore throat.  SKIN: No rash or itching.  CARDIOVASCULAR: per HPI RESPIRATORY: No shortness of breath, cough or sputum.  GASTROINTESTINAL: No anorexia, nausea, vomiting or diarrhea. No abdominal pain or blood.  GENITOURINARY: No burning on urination, no polyuria NEUROLOGICAL: No headache, dizziness, syncope, paralysis, ataxia, numbness or tingling in the extremities. No change in bowel or bladder control.  MUSCULOSKELETAL: No muscle, back pain, joint pain or stiffness.  LYMPHATICS: No enlarged nodes. No history of splenectomy.  PSYCHIATRIC: No history of depression or anxiety.  ENDOCRINOLOGIC: No reports of sweating, cold or heat intolerance. No polyuria or polydipsia.  Marland Kitchen   Physical Examination p 94 bp 118/70 Wt 148 lbs BMI 27 Gen: resting comfortably, no acute distress HEENT: no scleral icterus, pupils equal round and reactive, no palptable cervical adenopathy,  CV: RRR, no m/r/g, no JVD, no carotid bruits Resp: Clear to auscultation bilaterally GI: abdomen is soft, non-tender, non-distended, normal  bowel sounds, no hepatosplenomegaly MSK: extremities are warm, no edema.  Skin: warm, no rash Neuro:  no focal deficits Psych: appropriate affect   Diagnostic Studies Echo PCP Office 03/2013  My read: LVEF 60-65%, cannot distinguish WMAs due to imaging, grade I diastolic dysfunction, aortic and mitral valve anular calcification, small pericardial effusino adjacent to RV with no tamponade.   04/2013 Stress echo Study Conclusions  - Stress ECG conclusions: The stress ECG was normal. There were no ischemic changes and no significan arrhythmias. - Staged echo: Normal echo stress Impressions:  - Normal study after maximal exercise. Oxygen saturations remained above 96% throughout study, no evidence of hypoxemia.  Stress results: Maximal heart rate during stress was 136bpm (95% of maximal predicted heart rate). The maximal predicted heart rate was 144bpm.The target heart rate was achieved. The heart rate response to stress was normal. There was a normal resting blood pressure with an appropriate response to stress. The rate-pressure product for the peak heart rate and blood pressure was 16380mm Hg/min. The patient experienced no chest pain during stress.  ------------------------------------------------------------ Stress ECG: The stress ECG was normal. There were no ischemic changes and no significan arrhythmias.  ------------------------------------------------------------ Stress echo results: Left ventricular ejection fraction was normal at rest and with stress. Normal echo stress   07/2013 CT chest Indication: Cough, myelodysplastic syndrome  Comparison Exams: None  Protocol: Contiguous 1.25 mm axial images were obtained from the neck base through the upper abdomen without intravenous contrast in inspiration with bone and soft tissue algorithm reconstruction followed by high resolution expiratory images.  Findings: The central airways are patent. No evidence of  bronchiectasis. There are multiple bilateral 3 to 4 mm pulmonary nodules (series 5, image 134, 143, 162, 81, 97, 114, 120, 131 and 133).  Neck base demonstrates a small low-attenuation lesion within the right thyroid lobe which contains small coarse calcifications. The heart, aorta, and pulmonary arteries are of normal size and configuration. There is severe calcification of the left circumflex coronary artery. Scattered calcifications are seen within the thoracic aorta. There is a loculated pericardial effusion.  No mediastinal, hilar, or axillary lymphadenopathy.  Imaged portions of the upper abdomen demonstrates prior cholecystectomy. The spleen is enlarged measuring up to 17.0 cm. No suspicious lytic or sclerotic osseous lesions.  Impression: 1. No evidence of bronchiectasis. There are multiple 3 to 4 mm pulmonary nodules. Recommend follow-up chest CT in 12 months to assess resolution. 2. Splenomegaly.     Assessment and Plan  1. SOB/Atypical chest pain - patient with noted coronary atherosclerosis on CT chest, stress echo without evidence of ischemia. Appears to be non-obstructive/non-function disease - no further cardiac workup at this time. Contniue risk factor modificaiton, no ASA due to chronic thrombocytopenia and easy bleeding due to her myelodysplastic disorder. She has normal lipids, no DM, no HTN, no tobacco    F/u  1 year  Arnoldo Lenis, M.D.

## 2013-11-04 ENCOUNTER — Other Ambulatory Visit (INDEPENDENT_AMBULATORY_CARE_PROVIDER_SITE_OTHER): Payer: Self-pay | Admitting: Internal Medicine

## 2013-11-10 ENCOUNTER — Other Ambulatory Visit (HOSPITAL_COMMUNITY): Payer: Self-pay | Admitting: Oncology

## 2013-11-10 DIAGNOSIS — D469 Myelodysplastic syndrome, unspecified: Secondary | ICD-10-CM

## 2013-11-10 DIAGNOSIS — D696 Thrombocytopenia, unspecified: Secondary | ICD-10-CM

## 2013-11-11 ENCOUNTER — Other Ambulatory Visit: Payer: Self-pay | Admitting: Radiology

## 2013-11-12 ENCOUNTER — Encounter (HOSPITAL_COMMUNITY): Payer: Self-pay

## 2013-11-12 ENCOUNTER — Ambulatory Visit (HOSPITAL_COMMUNITY)
Admission: RE | Admit: 2013-11-12 | Discharge: 2013-11-12 | Disposition: A | Discharge status: Home / Self Care | Payer: Medicare Other | Source: Ambulatory Visit | Attending: Oncology | Admitting: Oncology

## 2013-11-12 ENCOUNTER — Other Ambulatory Visit (HOSPITAL_COMMUNITY): Payer: Self-pay | Admitting: Oncology

## 2013-11-12 DIAGNOSIS — J45909 Unspecified asthma, uncomplicated: Secondary | ICD-10-CM | POA: Diagnosis not present

## 2013-11-12 DIAGNOSIS — D469 Myelodysplastic syndrome, unspecified: Secondary | ICD-10-CM

## 2013-11-12 DIAGNOSIS — C946 Myelodysplastic disease, not classified: Secondary | ICD-10-CM

## 2013-11-12 DIAGNOSIS — R161 Splenomegaly, not elsewhere classified: Secondary | ICD-10-CM | POA: Diagnosis not present

## 2013-11-12 DIAGNOSIS — K219 Gastro-esophageal reflux disease without esophagitis: Secondary | ICD-10-CM | POA: Diagnosis not present

## 2013-11-12 DIAGNOSIS — D696 Thrombocytopenia, unspecified: Secondary | ICD-10-CM

## 2013-11-12 HISTORY — DX: Disease of blood and blood-forming organs, unspecified: D75.9

## 2013-11-12 LAB — PROTIME-INR
INR: 1.31 (ref 0.00–1.49)
Prothrombin Time: 16.5 seconds — ABNORMAL HIGH (ref 11.6–15.2)

## 2013-11-12 LAB — CBC WITH DIFFERENTIAL/PLATELET
BASOS PCT: 2 % — AB (ref 0–1)
Basophils Absolute: 0 10*3/uL (ref 0.0–0.1)
EOS PCT: 0 % (ref 0–5)
Eosinophils Absolute: 0 10*3/uL (ref 0.0–0.7)
HCT: 24.3 % — ABNORMAL LOW (ref 36.0–46.0)
HEMOGLOBIN: 7.7 g/dL — AB (ref 12.0–15.0)
Lymphocytes Relative: 23 % (ref 12–46)
Lymphs Abs: 0.6 10*3/uL — ABNORMAL LOW (ref 0.7–4.0)
MCH: 24.8 pg — AB (ref 26.0–34.0)
MCHC: 31.7 g/dL (ref 30.0–36.0)
MCV: 78.4 fL (ref 78.0–100.0)
MONO ABS: 0.7 10*3/uL (ref 0.1–1.0)
Monocytes Relative: 28 % — ABNORMAL HIGH (ref 3–12)
NEUTROS PCT: 47 % (ref 43–77)
Neutro Abs: 1.1 10*3/uL — ABNORMAL LOW (ref 1.7–7.7)
PLATELETS: 80 10*3/uL — AB (ref 150–400)
RBC: 3.1 MIL/uL — AB (ref 3.87–5.11)
RDW: 22.1 % — ABNORMAL HIGH (ref 11.5–15.5)
WBC: 2.4 10*3/uL — AB (ref 4.0–10.5)

## 2013-11-12 LAB — APTT: aPTT: 60 seconds — ABNORMAL HIGH (ref 24–37)

## 2013-11-12 MED ORDER — MIDAZOLAM HCL 2 MG/2ML IJ SOLN
INTRAMUSCULAR | Status: AC | PRN
  Administered 2013-11-12: 1 mg via INTRAVENOUS
  Administered 2013-11-12: 0.5 mg via INTRAVENOUS

## 2013-11-12 MED ORDER — HEPARIN SOD (PORK) LOCK FLUSH 100 UNIT/ML IV SOLN
INTRAVENOUS | Status: AC
  Filled 2013-11-12: qty 5

## 2013-11-12 MED ORDER — LIDOCAINE HCL 1 % IJ SOLN
INTRAMUSCULAR | Status: AC
  Filled 2013-11-12: qty 20

## 2013-11-12 MED ORDER — VANCOMYCIN HCL IN DEXTROSE 1-5 GM/200ML-% IV SOLN
1000.0000 mg | INTRAVENOUS | Status: AC
  Administered 2013-11-12: 1000 mg via INTRAVENOUS
  Filled 2013-11-12: qty 200

## 2013-11-12 MED ORDER — FENTANYL CITRATE 0.05 MG/ML IJ SOLN
INTRAMUSCULAR | Status: AC
  Filled 2013-11-12: qty 4

## 2013-11-12 MED ORDER — SODIUM CHLORIDE 0.9 % IV SOLN
INTRAVENOUS | Status: DC
  Administered 2013-11-12: 08:00:00 via INTRAVENOUS

## 2013-11-12 MED ORDER — FENTANYL CITRATE 0.05 MG/ML IJ SOLN
INTRAMUSCULAR | Status: AC | PRN
  Administered 2013-11-12: 50 ug via INTRAVENOUS

## 2013-11-12 MED ORDER — MIDAZOLAM HCL 2 MG/2ML IJ SOLN
INTRAMUSCULAR | Status: AC
  Filled 2013-11-12: qty 6

## 2013-11-12 NOTE — Procedures (Signed)
RIJV PAC SVC RA No comp 

## 2013-11-12 NOTE — Discharge Instructions (Signed)

## 2013-11-12 NOTE — H&P (Signed)
Chief Complaint: "I'm here for a port a cath"  Referring Physician(s): Neijstrom,Eric S  History of Present Illness: Alicia Campbell is a 78 y.o. female with history of MDS/pancytopenia , splenomegaly, poor venous access who presents today for port a cath placement .  Past Medical History  Diagnosis Date  . Asthma   . GERD (gastroesophageal reflux disease)   . Blood dyscrasia     Past Surgical History  Procedure Laterality Date  . Cholecystectomy    . Rotator cuff repair      left  . Abdominal hysterectomy      Ovaries intact  . Colonoscopy    . Upper gastrointestinal endoscopy    . Esophagogastroduodenoscopy N/A 04/11/2013    Procedure: ESOPHAGOGASTRODUODENOSCOPY (EGD);  Surgeon: Rogene Houston, MD;  [facility]: [facility];  Service: Endoscopy;  Laterality: N/A;  830-moved to 1020 Ann to notify pt  . Balloon dilation N/A 04/11/2013    Procedure: BALLOON DILATION;  Surgeon: Rogene Houston, MD;  Location: [facility];  Service: Endoscopy;  Laterality: N/A;  Venia Minks dilation N/A 04/11/2013    Procedure: Venia Minks DILATION;  Surgeon: Rogene Houston, MD;  [facility]: [facility];  Service: Endoscopy;  Laterality: N/A;  . Savory dilation N/A 04/11/2013    Procedure: SAVORY DILATION;  Surgeon: Rogene Houston, MD;  [facility]: [facility];  Service: Endoscopy;  Laterality: N/A;    Allergies: Amoxicillin; Clindamycin/lincomycin; Codeine; Hydrocortisone; and Penicillins  Medications: Prior to Admission medications   Medication Sig Start Date End Date Taking? Authorizing Provider  acetaminophen (TYLENOL) 325 MG tablet Take 650 mg by mouth daily as needed for moderate pain or headache.   Yes Historical Provider, MD  albuterol (PROVENTIL HFA;VENTOLIN HFA) 108 (90 BASE) MCG/ACT inhaler Inhale 2 puffs into the lungs every 6 (six) hours as needed for wheezing or shortness of breath.   Yes Historical Provider, MD  budesonide-formoterol (SYMBICORT) 80-4.5 MCG/ACT inhaler Inhale 2  puffs into the lungs daily.   Yes Historical Provider, MD  naphazoline-glycerin (CLEAR EYES REDNESS RELIEF) 0.012-0.2 % SOLN Place 1 drop into both eyes as needed for irritation.   Yes Historical Provider, MD  omeprazole (PRILOSEC) 40 MG capsule Take 40 mg by mouth 2 (two) times daily.   Yes Historical Provider, MD  [place], [place]   Yes Historical Provider, MD  pantoprazole (PROTONIX) 40 MG tablet TAKE ONE TABLET BY MOUTH TWICE DAILY BEFORE  MEALS 11/04/13   Butch Penny, NP  pantoprazole (PROTONIX) 40 MG tablet Take 40 mg by mouth 2 (two) times daily.    Historical Provider, MD    Family History  Problem Relation Age of Onset  . Heart attack Mother 56  . Congenital heart disease Mother   . Diabetes Father     History   Social History  . Marital Status: Widowed    Spouse Name: N/A    Number of Children: N/A  . Years of Education: N/A   Occupational History  .      retired Pharmacist, hospital   Social History Main Topics  . Smoking status: Never Smoker   . Smokeless tobacco: Never Used  . Alcohol Use: No  . Drug Use: No  . Sexual Activity: Not on file   Other Topics Concern  . Not on file   Social History Narrative        Review of Systems  Constitutional: Positive for fatigue. Negative for fever and chills.  Respiratory: Positive for cough and shortness of breath. Negative for wheezing.   Cardiovascular: Negative for chest pain.  Gastrointestinal: Positive for nausea. Negative for vomiting, abdominal pain and blood in stool.  Genitourinary: Negative for dysuria and hematuria.  Musculoskeletal: Negative for back pain.  Skin: Positive for pallor.  Neurological: Positive for headaches.  Hematological: Bruises/bleeds easily.    Vital Signs: BP 139/58 mmHg  Pulse 105  Temp(Src) 99.1 F (37.3 C) (Oral)  Resp 16  Ht 5\' 2"  (1.575 m)  Wt 141 lb (63.957 kg)  BMI 25.78 kg/m2  SpO2 100%  Physical Exam  Constitutional:  She is oriented to person, place, and time. She appears well-developed and well-nourished.  Cardiovascular: Regular rhythm.   tachy  Pulmonary/Chest: Effort normal and breath sounds normal.  Abdominal: Soft. Bowel sounds are normal. There is no tenderness.  splenomegaly  Musculoskeletal: Normal range of motion. She exhibits no edema.  Neurological: She is alert and oriented to person, place, and time.    Imaging: No results found.  Labs:  CBC:  Recent Labs  04/08/13 1009 11/12/13 0800  WBC 2.4* 2.4*  HGB 8.0* 7.7*  HCT 25.0* 24.3*  PLT 56* 80*    COAGS:  Recent Labs  04/08/13 1009 11/12/13 0800  INR 1.22 1.31  APTT  --  60*    BMP: No results for input(s): NA, K, CL, CO2, GLUCOSE, BUN, CALCIUM, CREATININE, GFRNONAA, GFRAA in the last 8760 hours.  Invalid input(s): CMP  LIVER FUNCTION TESTS: No results for input(s): BILITOT, AST, ALT, ALKPHOS, PROT, ALBUMIN in the last 8760 hours.  TUMOR MARKERS: No results for input(s): AFPTM, CEA, CA199, CHROMGRNA in the last 8760 hours.  Assessment and Plan: Alicia Campbell is a 78 y.o. female with history of MDS/pancytopenia, splenomegaly, poor venous access who presents today for port a cath placement . Details/risks of procedure d/w pt/daughter with their understanding and consent.           Signed: Autumn Messing 11/12/2013, 9:14 AM

## 2014-08-21 ENCOUNTER — Other Ambulatory Visit (INDEPENDENT_AMBULATORY_CARE_PROVIDER_SITE_OTHER): Payer: Self-pay | Admitting: Internal Medicine

## 2014-09-23 ENCOUNTER — Other Ambulatory Visit (INDEPENDENT_AMBULATORY_CARE_PROVIDER_SITE_OTHER): Payer: Self-pay | Admitting: Internal Medicine

## 2014-09-23 NOTE — Telephone Encounter (Signed)
Duplicate order.

## 2014-10-01 ENCOUNTER — Other Ambulatory Visit (INDEPENDENT_AMBULATORY_CARE_PROVIDER_SITE_OTHER): Payer: Self-pay | Admitting: Internal Medicine

## 2014-10-01 ENCOUNTER — Telehealth (INDEPENDENT_AMBULATORY_CARE_PROVIDER_SITE_OTHER): Payer: Self-pay | Admitting: *Deleted

## 2014-10-01 DIAGNOSIS — K219 Gastro-esophageal reflux disease without esophagitis: Secondary | ICD-10-CM

## 2014-10-01 MED ORDER — PANTOPRAZOLE SODIUM 40 MG PO TBEC: 40.0000 mg | DELAYED_RELEASE_TABLET | Freq: Two times a day (BID) | ORAL | Status: DC

## 2014-10-01 NOTE — Telephone Encounter (Signed)
Needs pantoprazole refilled. [facility] keeps telling her they have sent it over and never heard back. Her return phone number is [phone].

## 2014-10-01 NOTE — Telephone Encounter (Signed)
Rx sent to her pharmacy 

## 2015-05-05 ENCOUNTER — Encounter: Payer: Self-pay | Admitting: *Deleted

## 2015-05-05 ENCOUNTER — Ambulatory Visit (INDEPENDENT_AMBULATORY_CARE_PROVIDER_SITE_OTHER): Payer: Medicare Other | Admitting: Cardiology

## 2015-05-05 ENCOUNTER — Encounter: Payer: Self-pay | Admitting: Cardiology

## 2015-05-05 VITALS — BP 126/77 | HR 93 | Ht 62.0 in | Wt 140.0 lb

## 2015-05-05 DIAGNOSIS — R0602 Shortness of breath: Secondary | ICD-10-CM

## 2015-05-05 DIAGNOSIS — R0789 Other chest pain: Secondary | ICD-10-CM

## 2015-05-05 NOTE — Progress Notes (Signed)
Patient ID: Alicia Campbell, female   DOB: March 08, 1935, 80 y.o.   MRN: [id number]     Clinical Summary Ms. Haworth is a 80 y.o.female seen today for follow up of the following medical problems.   1. Dyspnea/Chest pain  - Occurs with activity, such as walking short distances.  - Can get chest pain at times x 4 months. Sharp pain in midchest to back, mild pain. Occurs only with exertion. She is unsure how long pains lasts for. She is unsure of change in frequency or severity over time.  - denies any LE edema, no orthopnea though sleeps on wedge for GERD, no PND  - hx of asthma, followed at [facility]. Has been treated over the last 4 months with abx and steroids without improvement in her SOB. She also has anemia with most recent Hgb 8.2, this is followed by her pcp.  - recent CT chest at [facility] 06/2013 for cough, incidental finding of severe CAD of LCX.  - stress echo 04/2013 without ischemia - reports multiple transfusions, often feels fatigued and SOB prior to transfusions. When blood counts get low and she exerts herself, she gets significant SOB and weakness. Can have palpitations during these episodes.    Past Medical History  Diagnosis Date  . Asthma   . GERD (gastroesophageal reflux disease)   . Blood dyscrasia      Allergies  Allergen Reactions  . Amoxicillin Itching  . Clindamycin/Lincomycin Itching  . Codeine Nausea And Vomiting  . Hydrocortisone Other (See Comments)    "bladder infection"  . Penicillins Hives     Current Outpatient Prescriptions  Medication Sig Dispense Refill  . acetaminophen (TYLENOL) 325 MG tablet Take 650 mg by mouth daily as needed for moderate pain or headache.    . albuterol (PROVENTIL HFA;VENTOLIN HFA) 108 (90 BASE) MCG/ACT inhaler Inhale 2 puffs into the lungs every 6 (six) hours as needed for wheezing or shortness of breath.    . budesonide-formoterol (SYMBICORT) 80-4.5 MCG/ACT inhaler Inhale 2 puffs into the lungs daily.    .  naphazoline-glycerin (CLEAR EYES REDNESS RELIEF) 0.012-0.2 % SOLN Place 1 drop into both eyes as needed for irritation.    Marland Kitchen omeprazole (PRILOSEC) 40 MG capsule Take 40 mg by mouth 2 (two) times daily.    . pantoprazole (PROTONIX) 40 MG tablet TAKE ONE TABLET BY MOUTH TWICE DAILY BEFORE  MEALS 60 tablet 0  . pantoprazole (PROTONIX) 40 MG tablet Take 1 tablet (40 mg total) by mouth 2 (two) times daily. 60 tablet 3  . [place], [place]     No current facility-administered medications for this visit.     Past Surgical History  Procedure Laterality Date  . Cholecystectomy    . Rotator cuff repair      left  . Abdominal hysterectomy      Ovaries intact  . Colonoscopy    . Upper gastrointestinal endoscopy    . Esophagogastroduodenoscopy N/A 04/11/2013    Procedure: ESOPHAGOGASTRODUODENOSCOPY (EGD);  Surgeon: Rogene Houston, MD;  [facility];  Service: Endoscopy;  Laterality: N/A;  830-moved to 1020 Ann to notify pt  . Balloon dilation N/A 04/11/2013    Procedure: BALLOON DILATION;  Surgeon: Rogene Houston, MD;  [facility];  Service: Endoscopy;  Laterality: N/A;  Venia Minks dilation N/A 04/11/2013    Procedure: Venia Minks DILATION;  Surgeon: Rogene Houston, MD;  [facility];  Service: Endoscopy;  Laterality: N/A;  . Savory dilation N/A 04/11/2013    Procedure: SAVORY DILATION;  Surgeon: Rogene Houston, MD;  [facility];  Service: Endoscopy;  Laterality: N/A;     Allergies  Allergen Reactions  . Amoxicillin Itching  . Clindamycin/Lincomycin Itching  . Codeine Nausea And Vomiting  . Hydrocortisone Other (See Comments)    "bladder infection"  . Penicillins Hives      Family History  Problem Relation Age of Onset  . Heart attack Mother 40  . Congenital heart disease Mother   . Diabetes Father      Social History Ms. Slingerland reports that she has never smoked. She has never used  smokeless tobacco. Ms. Milberger reports that she does not drink alcohol.   Review of Systems CONSTITUTIONAL: No weight loss, fever, chills, weakness or fatigue.  HEENT: Eyes: No visual loss, blurred vision, double vision or yellow sclerae.No hearing loss, sneezing, congestion, runny nose or sore throat.  SKIN: No rash or itching.  CARDIOVASCULAR: per HPI RESPIRATORY: No shortness of breath, cough or sputum.  GASTROINTESTINAL: No anorexia, nausea, vomiting or diarrhea. No abdominal pain or blood.  GENITOURINARY: No burning on urination, no polyuria NEUROLOGICAL: No headache, dizziness, syncope, paralysis, ataxia, numbness or tingling in the extremities. No change in bowel or bladder control.  MUSCULOSKELETAL: No muscle, back pain, joint pain or stiffness.  LYMPHATICS: No enlarged nodes. No history of splenectomy.  PSYCHIATRIC: No history of depression or anxiety.  ENDOCRINOLOGIC: No reports of sweating, cold or heat intolerance. No polyuria or polydipsia.  Marland Kitchen   Physical Examination Filed Vitals:   05/05/15 0856  BP: 126/77  Pulse: 93   Filed Vitals:   05/05/15 0856  Height: 5\' 2"  (1.575 m)  Weight: 140 lb (63.504 kg)    Gen: resting comfortably, no acute distress HEENT: no scleral icterus, pupils equal round and reactive, no palptable cervical adenopathy,  CV: RRR, no m/r/g, nojvd Resp: Clear to auscultation bilaterally GI: abdomen is soft, non-tender, non-distended, normal bowel sounds, no hepatosplenomegaly MSK: extremities are warm, no edema.  Skin: warm, no rash Neuro:  no focal deficits Psych: appropriate affect   Diagnostic Studies  Echo PCP Office 03/2013  My read: LVEF 60-65%, cannot distinguish WMAs due to imaging, grade I diastolic dysfunction, aortic and mitral valve anular calcification, small pericardial effusino adjacent to RV with no tamponade.   04/2013 Stress echo Study Conclusions  - Stress ECG conclusions: The stress ECG was normal. There were no  ischemic changes and no significan arrhythmias. - Staged echo: Normal echo stress Impressions:  - Normal study after maximal exercise. Oxygen saturations remained above 96% throughout study, no evidence of hypoxemia.  Stress results: Maximal heart rate during stress was 136bpm (95% of maximal predicted heart rate). The maximal predicted heart rate was 144bpm.The target heart rate was achieved. The heart rate response to stress was normal. There was a normal resting blood pressure with an appropriate response to stress. The rate-pressure product for the peak heart rate and blood pressure was 16328mm Hg/min. The patient experienced no chest pain during stress.  ------------------------------------------------------------ Stress ECG: The stress ECG was normal. There were no ischemic changes and no significan arrhythmias.  ------------------------------------------------------------ Stress echo results: Left ventricular ejection fraction was normal at rest and with stress. Normal echo stress   07/2013 CT chest Indication: Cough, myelodysplastic syndrome  Comparison Exams: None  Protocol: Contiguous 1.25 mm axial images were obtained from the neck base through the upper abdomen without intravenous contrast in inspiration  with bone and soft tissue algorithm reconstruction followed by high resolution expiratory images.  Findings: The central airways are patent. No evidence of bronchiectasis. There are multiple bilateral 3 to 4 mm pulmonary nodules (series 5, image 134, 143, 162, 81, 97, 114, 120, 131 and 133).  Neck base demonstrates a small low-attenuation lesion within the right thyroid lobe which contains small coarse calcifications. The heart, aorta, and pulmonary arteries are of normal size and configuration. There is severe calcification of the left circumflex coronary artery. Scattered calcifications are seen within the thoracic aorta. There is a loculated pericardial  effusion.  No mediastinal, hilar, or axillary lymphadenopathy.  Imaged portions of the upper abdomen demonstrates prior cholecystectomy. The spleen is enlarged measuring up to 17.0 cm. No suspicious lytic or sclerotic osseous lesions.  Impression: 1. No evidence of bronchiectasis. There are multiple 3 to 4 mm pulmonary nodules. Recommend follow-up chest CT in 12 months to assess resolution. 2. Splenomegaly.     Assessment and Plan  1. CAD - CAD seen by prior CT scan, stress test a few years ago was negative - reports recent worsening DOE, some correlation to her anemia which is transfusion dependent but there could also be a combined component of obstructive CAD worsened in setting of anemia - EKG in slnic show SR, no acute ischemic changes - we will obtain an exercise cardiolite.    F/u pending stress results.     Arnoldo Lenis, M.D.

## 2015-05-05 NOTE — Patient Instructions (Signed)
Your physician has requested that you have en exercise stress myoview. For further information please visit [email or web address]. Please follow instruction sheet, as given. Office will contact with results via phone or letter.   Continue all current medications. Follow up pending test results  

## 2015-05-10 ENCOUNTER — Encounter (HOSPITAL_COMMUNITY)
Admission: RE | Admit: 2015-05-10 | Discharge: 2015-05-10 | Disposition: A | Discharge status: Home / Self Care | Payer: Medicare Other | Source: Ambulatory Visit | Attending: Cardiology | Admitting: Cardiology

## 2015-05-10 ENCOUNTER — Encounter (HOSPITAL_COMMUNITY): Payer: Self-pay

## 2015-05-10 ENCOUNTER — Inpatient Hospital Stay (HOSPITAL_COMMUNITY): Admission: RE | Admit: 2015-05-10 | Payer: Medicare Other | Source: Ambulatory Visit

## 2015-05-10 DIAGNOSIS — R0602 Shortness of breath: Secondary | ICD-10-CM | POA: Insufficient documentation

## 2015-05-10 HISTORY — DX: Malignant (primary) neoplasm, unspecified: C80.1

## 2015-05-10 LAB — NM MYOCAR MULTI W/SPECT W/WALL MOTION / EF
CSEPPHR: 114 {beats}/min
LVDIAVOL: 36 mL (ref 46–106)
LVSYSVOL: 10 mL
NUC STRESS TID: 0.96
RATE: 0.23
Rest HR: 82 {beats}/min
SDS: 3
SRS: 9
SSS: 12

## 2015-05-10 MED ORDER — TECHNETIUM TC 99M SESTAMIBI GENERIC - CARDIOLITE
30.0000 | Freq: Once | INTRAVENOUS | Status: AC | PRN
  Administered 2015-05-10: 31.8 via INTRAVENOUS

## 2015-05-10 MED ORDER — TECHNETIUM TC 99M SESTAMIBI - CARDIOLITE
10.0000 | Freq: Once | INTRAVENOUS | Status: AC | PRN
  Administered 2015-05-10: 08:00:00 10 via INTRAVENOUS

## 2015-05-10 MED ORDER — REGADENOSON 0.4 MG/5ML IV SOLN
INTRAVENOUS | Status: AC
  Administered 2015-05-10: 0.4 mg via INTRAVENOUS
  Filled 2015-05-10: qty 5

## 2015-05-10 MED ORDER — SODIUM CHLORIDE 0.9% FLUSH
INTRAVENOUS | Status: AC
  Administered 2015-05-10: 10 mL via INTRAVENOUS
  Filled 2015-05-10: qty 10

## 2015-05-11 ENCOUNTER — Telehealth: Payer: Self-pay | Admitting: *Deleted

## 2015-05-11 NOTE — Telephone Encounter (Signed)
Pt aware, routed to pcp, appt made for 3 month f/u

## 2015-05-11 NOTE — Telephone Encounter (Signed)
-----   Message from Arnoldo Lenis, MD sent at 05/11/2015 12:19 PM EDT ----- Stress test looks good, no evidence of any significant blockages in her heart. Her symptoms appear to be mainly due to her anemia. F/u 3 months  Zandra Abts MD

## 2015-05-20 ENCOUNTER — Ambulatory Visit (INDEPENDENT_AMBULATORY_CARE_PROVIDER_SITE_OTHER): Payer: Medicare Other | Admitting: Otolaryngology

## 2015-05-20 DIAGNOSIS — H903 Sensorineural hearing loss, bilateral: Secondary | ICD-10-CM | POA: Diagnosis not present

## 2015-05-20 DIAGNOSIS — H9201 Otalgia, right ear: Secondary | ICD-10-CM

## 2015-06-21 ENCOUNTER — Other Ambulatory Visit (INDEPENDENT_AMBULATORY_CARE_PROVIDER_SITE_OTHER): Payer: Self-pay | Admitting: Internal Medicine

## 2015-08-03 DEATH — deceased

## 2015-08-20 ENCOUNTER — Ambulatory Visit (HOSPITAL_COMMUNITY): Payer: Medicare Other | Admitting: Hematology & Oncology

## 2015-08-25 ENCOUNTER — Ambulatory Visit: Payer: Medicare Other | Admitting: Cardiology

## 2018-04-20 IMAGING — NM NM MYOCAR MULTI W/SPECT W/WALL MOTION & EF
2 series · 12 of 12 positions shown · non-contrast
Comparison: none

[Series 1: rest · 8.28mm/px · 6 of 64 frames shown]
[frame 6/64]
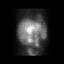
[frame 16/64]
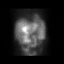
[frame 27/64]
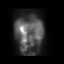
[frame 38/64]
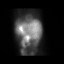
[frame 48/64]
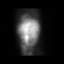
[frame 59/64]
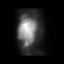

[Series 2: stress gated · 8.28mm/px · 6 of 64 frames shown]
[frame 6/64]
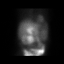
[frame 16/64]
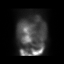
[frame 27/64]
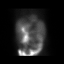
[frame 38/64]
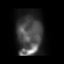
[frame 48/64]
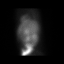
[frame 59/64]
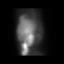

[12 of 12 positions shown; findings below may reference images not displayed]

Canned report from images found in remote index.

Refer to host system for actual result text.
